# Patient Record
Sex: Female | Born: 1970
Health system: Southern US, Community
[De-identification: ages and names within clinical notes are randomized; demographics above are authoritative.]

## PROBLEM LIST (undated history)

## (undated) DIAGNOSIS — E063 Autoimmune thyroiditis: Secondary | ICD-10-CM

## (undated) DIAGNOSIS — J449 Chronic obstructive pulmonary disease, unspecified: Secondary | ICD-10-CM

## (undated) DIAGNOSIS — I10 Essential (primary) hypertension: Secondary | ICD-10-CM

## (undated) DIAGNOSIS — I7300 Raynaud's syndrome without gangrene: Secondary | ICD-10-CM

## (undated) HISTORY — DX: Chronic obstructive pulmonary disease, unspecified: J44.9

## (undated) HISTORY — DX: Raynaud's syndrome without gangrene: I73.00

## (undated) HISTORY — DX: Essential (primary) hypertension: I10

## (undated) HISTORY — DX: Autoimmune thyroiditis: E06.3

## (undated) HISTORY — PX: TONSILLECTOMY: SUR1361

## (undated) HISTORY — PX: SKIN GRAFT: SHX250

---

## 2003-05-27 ENCOUNTER — Emergency Department (HOSPITAL_COMMUNITY): Admission: EM | Admit: 2003-05-27 | Discharge: 2003-05-27 | Payer: Self-pay | Admitting: Emergency Medicine

## 2003-05-27 ENCOUNTER — Encounter: Payer: Self-pay | Admitting: Emergency Medicine

## 2004-11-13 ENCOUNTER — Ambulatory Visit: Payer: Self-pay | Admitting: Internal Medicine

## 2004-12-05 ENCOUNTER — Ambulatory Visit: Payer: Self-pay | Admitting: Internal Medicine

## 2006-02-24 ENCOUNTER — Emergency Department (HOSPITAL_COMMUNITY): Admission: EM | Admit: 2006-02-24 | Discharge: 2006-02-25 | Payer: Self-pay | Admitting: Emergency Medicine

## 2006-03-03 ENCOUNTER — Ambulatory Visit: Payer: Self-pay | Admitting: Internal Medicine

## 2006-03-04 ENCOUNTER — Ambulatory Visit: Payer: Self-pay | Admitting: Internal Medicine

## 2006-12-26 ENCOUNTER — Inpatient Hospital Stay (HOSPITAL_COMMUNITY): Admission: AD | Admit: 2006-12-26 | Discharge: 2006-12-26 | Payer: Self-pay | Admitting: Obstetrics & Gynecology

## 2010-02-26 ENCOUNTER — Ambulatory Visit: Payer: Self-pay | Admitting: Physician Assistant

## 2010-02-26 DIAGNOSIS — I10 Essential (primary) hypertension: Secondary | ICD-10-CM | POA: Insufficient documentation

## 2010-02-26 DIAGNOSIS — E039 Hypothyroidism, unspecified: Secondary | ICD-10-CM | POA: Insufficient documentation

## 2010-02-26 DIAGNOSIS — T300 Burn of unspecified body region, unspecified degree: Secondary | ICD-10-CM | POA: Insufficient documentation

## 2010-03-02 ENCOUNTER — Encounter: Payer: Self-pay | Admitting: Physician Assistant

## 2010-03-06 ENCOUNTER — Ambulatory Visit (HOSPITAL_COMMUNITY): Admission: RE | Admit: 2010-03-06 | Discharge: 2010-03-06 | Payer: Self-pay | Admitting: Internal Medicine

## 2010-03-08 ENCOUNTER — Telehealth: Payer: Self-pay | Admitting: Physician Assistant

## 2010-03-12 ENCOUNTER — Ambulatory Visit: Payer: Self-pay | Admitting: Physician Assistant

## 2010-03-26 ENCOUNTER — Ambulatory Visit (HOSPITAL_COMMUNITY): Admission: RE | Admit: 2010-03-26 | Discharge: 2010-03-26 | Payer: Self-pay | Admitting: Physician Assistant

## 2010-03-26 ENCOUNTER — Ambulatory Visit: Payer: Self-pay | Admitting: Physician Assistant

## 2010-03-26 DIAGNOSIS — R799 Abnormal finding of blood chemistry, unspecified: Secondary | ICD-10-CM

## 2010-03-26 DIAGNOSIS — R05 Cough: Secondary | ICD-10-CM

## 2010-03-27 LAB — CONVERTED CEMR LAB
Alkaline Phosphatase: 105 units/L (ref 39–117)
Basophils Relative: 0 % (ref 0–1)
Eosinophils Absolute: 0.4 10*3/uL (ref 0.0–0.7)
Hemoglobin: 14.1 g/dL (ref 12.0–15.0)
MCHC: 32.6 g/dL (ref 30.0–36.0)
Monocytes Absolute: 0.7 10*3/uL (ref 0.1–1.0)
Neutro Abs: 6.9 10*3/uL (ref 1.7–7.7)
Platelets: 350 10*3/uL (ref 150–400)
Potassium: 3.5 meq/L (ref 3.5–5.3)
RBC: 4.57 M/uL (ref 3.87–5.11)
RDW: 13.2 % (ref 11.5–15.5)
Sodium: 137 meq/L (ref 135–145)
TSH: 9.126 microintl units/mL — ABNORMAL HIGH (ref 0.350–4.500)
Total Bilirubin: 0.3 mg/dL (ref 0.3–1.2)
Total Protein: 7.2 g/dL (ref 6.0–8.3)
WBC: 10.1 10*3/uL (ref 4.0–10.5)

## 2010-03-28 ENCOUNTER — Encounter: Payer: Self-pay | Admitting: Physician Assistant

## 2010-03-30 ENCOUNTER — Encounter: Payer: Self-pay | Admitting: Physician Assistant

## 2010-05-09 ENCOUNTER — Ambulatory Visit: Payer: Self-pay | Admitting: Physician Assistant

## 2010-05-09 DIAGNOSIS — R6889 Other general symptoms and signs: Secondary | ICD-10-CM | POA: Insufficient documentation

## 2010-05-09 DIAGNOSIS — J4489 Other specified chronic obstructive pulmonary disease: Secondary | ICD-10-CM | POA: Insufficient documentation

## 2010-05-09 DIAGNOSIS — J449 Chronic obstructive pulmonary disease, unspecified: Secondary | ICD-10-CM | POA: Insufficient documentation

## 2010-05-10 ENCOUNTER — Telehealth: Payer: Self-pay | Admitting: Physician Assistant

## 2010-05-10 ENCOUNTER — Encounter: Payer: Self-pay | Admitting: Physician Assistant

## 2010-05-10 LAB — CONVERTED CEMR LAB
Free T4: 1.2 ng/dL (ref 0.80–1.80)
TSH: 3.458 microintl units/mL (ref 0.350–4.500)

## 2010-05-11 ENCOUNTER — Encounter: Payer: Self-pay | Admitting: Physician Assistant

## 2010-05-16 ENCOUNTER — Encounter: Payer: Self-pay | Admitting: Physician Assistant

## 2010-08-09 ENCOUNTER — Ambulatory Visit: Payer: Self-pay | Admitting: Physician Assistant

## 2010-08-09 DIAGNOSIS — L509 Urticaria, unspecified: Secondary | ICD-10-CM | POA: Insufficient documentation

## 2010-08-09 DIAGNOSIS — F341 Dysthymic disorder: Secondary | ICD-10-CM

## 2010-08-09 LAB — CONVERTED CEMR LAB
Bilirubin Urine: NEGATIVE
Glucose, Urine, Semiquant: NEGATIVE
pH: 6

## 2010-08-10 ENCOUNTER — Encounter (INDEPENDENT_AMBULATORY_CARE_PROVIDER_SITE_OTHER): Payer: Self-pay | Admitting: Internal Medicine

## 2010-08-10 ENCOUNTER — Encounter: Payer: Self-pay | Admitting: Physician Assistant

## 2010-08-10 DIAGNOSIS — N390 Urinary tract infection, site not specified: Secondary | ICD-10-CM | POA: Insufficient documentation

## 2010-08-10 LAB — CONVERTED CEMR LAB
BUN: 7 mg/dL (ref 6–23)
CO2: 26 meq/L (ref 19–32)
Calcium: 9.6 mg/dL (ref 8.4–10.5)
Chloride: 95 meq/L — ABNORMAL LOW (ref 96–112)
Creatinine, Ser: 0.57 mg/dL (ref 0.40–1.20)
Glucose, Bld: 85 mg/dL (ref 70–99)
Potassium: 3.6 meq/L (ref 3.5–5.3)
Sodium: 135 meq/L (ref 135–145)

## 2010-08-15 ENCOUNTER — Encounter: Admission: RE | Admit: 2010-08-15 | Discharge: 2010-08-15 | Payer: Self-pay | Admitting: Internal Medicine

## 2010-08-17 ENCOUNTER — Encounter: Payer: Self-pay | Admitting: Physician Assistant

## 2010-08-20 ENCOUNTER — Encounter: Payer: Self-pay | Admitting: Physician Assistant

## 2010-08-20 ENCOUNTER — Ambulatory Visit (HOSPITAL_COMMUNITY): Admission: RE | Admit: 2010-08-20 | Discharge: 2010-08-20 | Payer: Self-pay | Admitting: Internal Medicine

## 2010-08-23 ENCOUNTER — Encounter: Payer: Self-pay | Admitting: Physician Assistant

## 2010-08-23 ENCOUNTER — Encounter: Admission: RE | Admit: 2010-08-23 | Discharge: 2010-08-23 | Payer: Self-pay | Admitting: Internal Medicine

## 2010-09-20 ENCOUNTER — Ambulatory Visit: Payer: Self-pay | Admitting: Physician Assistant

## 2010-09-20 LAB — CONVERTED CEMR LAB
BUN: 7 mg/dL (ref 6–23)
CO2: 26 meq/L (ref 19–32)
Calcium: 9.1 mg/dL (ref 8.4–10.5)
Creatinine, Ser: 0.6 mg/dL (ref 0.40–1.20)
Glucose, Bld: 99 mg/dL (ref 70–99)
Glucose, Urine, Semiquant: NEGATIVE
Ketones, urine, test strip: NEGATIVE
Nitrite: POSITIVE
Protein, U semiquant: NEGATIVE

## 2010-09-21 ENCOUNTER — Telehealth: Payer: Self-pay | Admitting: Physician Assistant

## 2010-09-21 ENCOUNTER — Encounter: Payer: Self-pay | Admitting: Physician Assistant

## 2010-09-27 ENCOUNTER — Ambulatory Visit (HOSPITAL_COMMUNITY): Admission: RE | Admit: 2010-09-27 | Discharge: 2010-09-27 | Payer: Self-pay | Admitting: Internal Medicine

## 2010-09-28 ENCOUNTER — Encounter (INDEPENDENT_AMBULATORY_CARE_PROVIDER_SITE_OTHER): Payer: Self-pay | Admitting: *Deleted

## 2010-09-28 DIAGNOSIS — E042 Nontoxic multinodular goiter: Secondary | ICD-10-CM

## 2010-10-21 ENCOUNTER — Observation Stay (HOSPITAL_COMMUNITY): Admission: EM | Admit: 2010-10-21 | Discharge: 2010-10-21 | Payer: Self-pay | Admitting: Emergency Medicine

## 2010-12-17 ENCOUNTER — Ambulatory Visit: Payer: Self-pay | Admitting: Nurse Practitioner

## 2010-12-21 ENCOUNTER — Ambulatory Visit: Payer: Self-pay | Admitting: Nurse Practitioner

## 2010-12-21 DIAGNOSIS — M25529 Pain in unspecified elbow: Secondary | ICD-10-CM

## 2010-12-28 ENCOUNTER — Ambulatory Visit: Payer: Self-pay | Admitting: Nurse Practitioner

## 2011-01-19 ENCOUNTER — Other Ambulatory Visit: Payer: Self-pay | Admitting: Internal Medicine

## 2011-01-19 DIAGNOSIS — N63 Unspecified lump in unspecified breast: Secondary | ICD-10-CM

## 2011-01-20 ENCOUNTER — Encounter: Payer: Self-pay | Admitting: Internal Medicine

## 2011-01-27 LAB — CONVERTED CEMR LAB
AST: 44 units/L — ABNORMAL HIGH (ref 0–37)
Albumin: 4.2 g/dL (ref 3.5–5.2)
Amphetamine Screen, Ur: NEGATIVE
Basophils Absolute: 0.1 10*3/uL (ref 0.0–0.1)
Basophils Relative: 1 % (ref 0–1)
CO2: 26 meq/L (ref 19–32)
Chloride: 100 meq/L (ref 96–112)
Cocaine Metabolites: NEGATIVE
Creatinine, Ser: 0.64 mg/dL (ref 0.40–1.20)
Creatinine,U: 122.1 mg/dL
Free T4: 0.83 ng/dL (ref 0.80–1.80)
HCT: 41.4 % (ref 36.0–46.0)
Hemoglobin: 13.6 g/dL (ref 12.0–15.0)
Lymphocytes Relative: 22 % (ref 12–46)
MCHC: 32.9 g/dL (ref 30.0–36.0)
MCV: 95.6 fL (ref 78.0–100.0)
Monocytes Absolute: 0.8 10*3/uL (ref 0.1–1.0)
Neutro Abs: 7.5 10*3/uL (ref 1.7–7.7)
Opiate Screen, Urine: NEGATIVE
Platelets: 327 10*3/uL (ref 150–400)
Potassium: 5.1 meq/L (ref 3.5–5.3)
Propoxyphene: NEGATIVE
RDW: 13.2 % (ref 11.5–15.5)
Sodium: 136 meq/L (ref 135–145)
TSH: 6.537 microintl units/mL — ABNORMAL HIGH (ref 0.350–4.500)
Total Protein: 7.5 g/dL (ref 6.0–8.3)

## 2011-01-29 ENCOUNTER — Encounter (INDEPENDENT_AMBULATORY_CARE_PROVIDER_SITE_OTHER): Payer: Self-pay | Admitting: Internal Medicine

## 2011-01-29 NOTE — Letter (Signed)
Summary: Handout Printed  Printed Handout:  - Diet - Sodium-Controlled 
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
## 2011-01-29 NOTE — Progress Notes (Signed)
Summary[facility] Request[facility] for current [facility]  Phone Note From Other Clinic   Summary of Call: Jenin[facility] just called in because the pt above has an appointment on Monday and they need last office notes or anything that states breast cancer history or any recent mammogram. office [phone] fax [phone] [facility] PA-c  Initial call taken by: Manon Hilding,  May 10, 2010 11:43 AM  Follow-up for Phone Call        records faxed.Marland KitchenMarland KitchenMikey College CMA  May 11, 2010 2:23 PM
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
## 2011-01-29 NOTE — Letter (Signed)
Summary: PLASTIC SURGEON NOTES  PLASTIC SURGEON NOTES   Imported By: Arta Bruce 04/16/2010 12:14:58  _____________________________________________________________________  External Attachment:    Type:   Image     Comment:   External Document

## 2011-01-29 NOTE — Letter (Signed)
Summary: MED/SOLUTIONS//APPROVED  MED/SOLUTIONS//APPROVED   Imported By: Arta Bruce 10/01/2010 12:40:44  _____________________________________________________________________  External Attachment:    Type:   Image     Comment:   External Document
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
## 2011-01-29 NOTE — Letter (Signed)
Summary: *HSN Results Follow up  [facility]  [address] [place], [place] [address]   Phone: [phone]  Fax: [phone]      05/10/2010   [name] [address] [place], [place]  [address]   Dear  Ms. Talishia Rippetoe,                            ____S.Drinkard,FNP   ____D. Gore,FNP       ____B. McPherson,MD   ____V. Rankins,MD    ____E. Mulberry,MD    ____N. Daphine Deutscher, FNP  ____D. Reche Dixon, MD    ____K. Philipp Deputy, MD    __x__S. Alben Spittle, PA-C     This letter is to inform you that your recent test(s):  _______Pap Smear    ___x____Lab Test     _______X-ray    __x_____ is within acceptable limits  _______ requires a medication change  _______ requires a follow-up lab visit  _______ requires a follow-up visit with your provider   Comments: Thyroid test ok.  Keep taking same dose.  Repeat lab (TSH) in 3 months.       _________________________________________________________ If you have any questions, please contact our office                     Sincerely,  Tereso Newcomer PA-C [facility]
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
## 2011-01-29 NOTE — Letter (Signed)
Summary: *HSN Results Follow up  [facility]  [address] [place], [place] [address]   Phone: [phone]  Fax: [phone]      08/10/2010   Kelly Deleon [address] [place], [place]  [address]   Dear  Kelly Deleon,                            ____S.Drinkard,FNP   ____D. Gore,FNP       ____B. McPherson,MD   ____V. Rankins,MD    ____E. Mulberry,MD    ____N. Daphine Deutscher, FNP  ____D. Reche Dixon, MD    ____K. Philipp Deputy, MD    __x__S. Alben Spittle, PA-C    This letter is to inform you that your recent test(s):  _______Pap Smear    ___x____Lab Test     _______X-ray    ___x____ is within acceptable limits  _______ requires a medication change  _______ requires a follow-up lab visit  _______ requires a follow-up visit with your provider   Comments:  Kidney function ok.  Thyroid levels are good.  Continue the same thyroid medicine.       _________________________________________________________ If you have any questions, please contact our office                     Sincerely,  Tereso Newcomer PA-C [facility]
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
## 2011-01-29 NOTE — Letter (Signed)
Summary: [facility]  [facility]   Imported By: Arta Bruce 09/25/2010 12:49:56  _____________________________________________________________________  External Attachment:    Type:   Image     Comment:   External Document

## 2011-01-29 NOTE — Progress Notes (Signed)
Summary: needs repeat thyroid u/s in 08/2010  Phone Note Outgoing Call   Summary of Call: Tell patient that ultrasound shows multiple benign appearing nodules. She needs to continue her thyroid med. and have a repeat ultrasound in 6 mos. Order on your desk. Initial call taken by: Tereso Newcomer PA-C,  March 08, 2010 1:42 PM  Follow-up for Phone Call        Left message on answering machine for pt to call back...Marland KitchenMarland KitchenArmenia Shannon  March 08, 2010 3:45 PM   Left message on answering machine for pt to call back...Marland KitchenMarland KitchenArmenia Shannon  March 09, 2010 10:53 AM   Additional Follow-up for Phone Call Additional follow up Details #1::        pt is aware Additional Follow-up by: Armenia Shannon,  March 13, 2010 8:14 AM

## 2011-01-29 NOTE — Letter (Signed)
Summary: [facility]  [facility]   Imported By: Arta Bruce 04/02/2010 09:51:14  _____________________________________________________________________  External Attachment:    Type:   Image     Comment:   External Document

## 2011-01-29 NOTE — Letter (Signed)
Summary: *HSN Results Follow up  [facility]  [address] [place], [place] [address]   Phone: [phone]  Fax: [phone]      09/28/2010   Kelly Deleon [address] [place], [place]  [address]   Dear  Ms. [name],                            ____S.Drinkard,FNP   ____D. Gore,FNP       ____B. McPherson,MD   ____V. Rankins,MD    ____E. Mulberry,MD    ____N. Daphine Deutscher, FNP  ____D. Reche Dixon, MD    ____K. Philipp Deputy, MD    ____Other     This letter is to inform you that your recent test(s):  _______Pap Smear    _______Lab Test     _______X-ray   ___X_____Ultrasound    ____X___ is within acceptable limits  _______ requires a medication change  _______ requires a follow-up lab visit  _______ requires a follow-up visit with your [name]   Comments: We will repeat in 1-2 years.       _________________________________________________________ If you have any questions, please contact our office                     Sincerely,  Armenia Shannon [facility]

## 2011-01-29 NOTE — Letter (Signed)
Summary: MED/SOLUTION//APPROVED  MED/SOLUTION//APPROVED   Imported By: Arta Bruce 08/23/2010 10:09:01  _____________________________________________________________________  External Attachment:    Type:   Image     Comment:   External Document

## 2011-01-29 NOTE — Letter (Signed)
Summary: MED/SOLUTIONS  MED/SOLUTIONS   Imported By: Arta Bruce 05/14/2010 10:51:33  _____________________________________________________________________  External Attachment:    Type:   Image     Comment:   External Document
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
## 2011-01-29 NOTE — Letter (Signed)
Summary: MED/SOLUTIONS/APPROVED  MED/SOLUTIONS/APPROVED   Imported By: Arta Bruce 05/08/2010 15:11:54  _____________________________________________________________________  External Attachment:    Type:   Image     Comment:   External Document

## 2011-01-29 NOTE — Letter (Signed)
Summary: PLASTIC SURGERY NOTE  PLASTIC SURGERY NOTE   Imported By: Arta Bruce 05/21/2010 09:22:09  _____________________________________________________________________  External Attachment:    Type:   Image     Comment:   External Document

## 2011-01-29 NOTE — Assessment & Plan Note (Signed)
Summary: Anxiety; COPD; HTN; Hives   Vital Signs:  Patient profile:   40 year old female Height:      65 inches Weight:      138 pounds BMI:     23.05 Temp:     98.7 degrees F oral Pulse rate:   88 / minute Pulse rhythm:   regular Resp:     18 per minute BP sitting:   110 / 86  (left arm) Cuff size:   regular  Vitals Entered By: [place] Shannon (September 20, 2010 9:46 AM) CC: f/u.... pt says she is only taking lisinopril/hctz and not norvasc... Is Patient Diabetic? No Pain Assessment Patient in pain? no       Does patient need assistance? Functional Status Self care Ambulation Normal   Primary Care Provider:  Tereso Newcomer PA-C  CC:  f/u.... pt says she is only taking lisinopril/hctz and not norvasc....  History of Present Illness: Here for f/u. Mood is slightly better.  Less anxious.  Using vistaril occ. Still has breakouts of "hives."  Has had for years.  Does not itch.  Somewhat tender. The TAC cream helps them resolve.  Daughter has same thing.  No joint problems.   Breathing better.  Coughing and wheezing less.  No chest pain or dyspnea. BP much better.  Feels better on Lisinopril/HCTZ.   Current Medications (verified): 1)  Norvasc 10 Mg Tabs (Amlodipine Besylate) .... Take 1 Tablet By Mouth Once A Day For Blood Pressure 2)  Synthroid 50 Mcg Tabs (Levothyroxine Sodium) .... Take 1 Tablet By Mouth Once A Day 3)  Lisinopril-Hydrochlorothiazide 20-12.5 Mg Tabs (Lisinopril-Hydrochlorothiazide) .... Take 1 Tablet By Mouth Once A Day For Blood Pressure (Pharmacy: Hctz 25 Mg Has Been D/c'd) 4)  Proventil Hfa 108 (90 Base) Mcg/act Aers (Albuterol Sulfate) .Marland Kitchen.. 1-2 Puffs Every 4-6 Hours As Needed For Cough or Wheezing 5)  Spiriva Handihaler 18 Mcg Caps (Tiotropium Bromide Monohydrate) .Marland Kitchen.. 1 Inhalation Once Daily 6)  Zoloft 50 Mg Tabs (Sertraline Hcl) .... Take 1 Tablet By Mouth Once A Day 7)  Vistaril 25 Mg Caps (Hydroxyzine Pamoate) .... Take 1 Capsule By Mouth Three  Times A Day As Needed For Anxiety 8)  Triamcinolone Acetonide 0.1 % Crea (Triamcinolone Acetonide) .... Apply To Rash Two Times A Day Until Clear; Do Not Use More Than One Week 9)  Macrobid 100 Mg Caps (Nitrofurantoin Monohyd Macro) .Marland Kitchen.. 1 Cap By Mouth Two Times A Day For 3 Days  Allergies (verified): No Known Drug Allergies  Physical Exam  General:  alert, well-developed, and well-nourished.   Head:  normocephalic and atraumatic.   Neck:  supple.   Lungs:  normal breath sounds and no wheezes.   Heart:  normal rate and regular rhythm.   Neurologic:  alert & oriented X3 and cranial nerves II-XII intact.   Skin:  scattered raised macules on right shoulder mildly tender to touch  Psych:  normally interactive.     Impression & Recommendations:  Problem # 1:  UTI (ICD-599.0) denies dysuria or frequency just completed tx of UTI reculture  The following medications were removed from the medication list:    Macrobid 100 Mg Caps (Nitrofurantoin monohyd macro) .Marland Kitchen... 1 cap by mouth two times a day for 3 days  Orders: UA Dipstick w/o Micro (manual) (60454) T-Culture, Urine ([id number])  Problem # 2:  HYPERTENSION (ICD-401.9) Assessment: Improved continue current meds  The following medications were removed from the medication list:    Norvasc 10 Mg Tabs (Amlodipine  besylate) .Marland Kitchen... Take 1 tablet by mouth once a day for blood pressure Her updated medication list for this problem includes:    Lisinopril-hydrochlorothiazide 20-12.5 Mg Tabs (Lisinopril-hydrochlorothiazide) .Marland Kitchen... Take 1 tablet by mouth once a day for blood pressure (pharmacy: hctz 25 mg has been d/c'd)  Orders: T-Basic Metabolic Panel [phone])  Problem # 3:  URTICARIA (ICD-708.9)  not 100% sure these are urticaria will send her to derm for eval  Orders: Dermatology Referral (Derma)  Problem # 4:  ANXIETY DEPRESSION (ICD-300.4) Assessment: Improved continue current med  Problem # 5:  COPD (ICD-496) will  track down PFTs cough and wheezing is better  Her updated medication list for this problem includes:    Proventil Hfa 108 (90 Base) Mcg/act Aers (Albuterol sulfate) .Marland Kitchen... 1-2 puffs every 4-6 hours as needed for cough or wheezing    Spiriva Handihaler 18 Mcg Caps (Tiotropium bromide monohydrate) .Marland Kitchen... 1 inhalation once daily  Problem # 6:  GOITER, MULTINODULAR (ICD-241.1) needs u/s this month to f/u  Complete Medication List: 1)  Synthroid 50 Mcg Tabs (Levothyroxine sodium) .... Take 1 tablet by mouth once a day 2)  Lisinopril-hydrochlorothiazide 20-12.5 Mg Tabs (Lisinopril-hydrochlorothiazide) .... Take 1 tablet by mouth once a day for blood pressure (pharmacy: hctz 25 mg has been d/c'd) 3)  Proventil Hfa 108 (90 Base) Mcg/act Aers (Albuterol sulfate) .Marland Kitchen.. 1-2 puffs every 4-6 hours as needed for cough or wheezing 4)  Spiriva Handihaler 18 Mcg Caps (Tiotropium bromide monohydrate) .Marland Kitchen.. 1 inhalation once daily 5)  Zoloft 50 Mg Tabs (Sertraline hcl) .... Take 1 tablet by mouth once a day 6)  Vistaril 25 Mg Caps (Hydroxyzine pamoate) .... Take 1 capsule by mouth three times a day as needed for anxiety 7)  Triamcinolone Acetonide 0.1 % Crea (Triamcinolone acetonide) .... Apply to rash two times a day until clear; do not use more than one week  Patient Instructions: 1)  Please request PFT report. 2)  Please schedule a follow-up appointment in 3 months for CPP. 3)  Someone will call you to arrange appt with Dermatology.   Laboratory Results   Urine Tests  Date/Time Received: September 20, 2010 10:16 AM   Routine Urinalysis   Glucose: negative   (Normal Range: Negative) Bilirubin: negative   (Normal Range: Negative) Ketone: negative   (Normal Range: Negative) Spec. Gravity: 1.020   (Normal Range: 1.003-1.035) Blood: negative   (Normal Range: Negative) pH: 5.5   (Normal Range: 5.0-8.0) Protein: negative   (Normal Range: Negative) Urobilinogen: 0.2   (Normal Range: 0-1) Nitrite:  positive   (Normal Range: Negative) Leukocyte Esterace: small   (Normal Range: Negative)

## 2011-01-29 NOTE — Letter (Signed)
Summary: TEST ORDER FORM/MAMMOGRAM//APPT DATE & TIME  TEST ORDER FORM/MAMMOGRAM//APPT DATE & TIME   Imported By: Arta Bruce 07/04/2010 15:46:25  _____________________________________________________________________  External Attachment:    Type:   Image     Comment:   External Document
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
## 2011-01-29 NOTE — Letter (Signed)
Summary: TEST ORDER FORM/ ULTRASOUND//APPT DATE & TIME  TEST ORDER FORM/ ULTRASOUND//APPT DATE & TIME   Imported By: Arta Bruce 04/27/2010 14:42:21  _____________________________________________________________________  External Attachment:    Type:   Image     Comment:   External Document

## 2011-01-29 NOTE — Progress Notes (Signed)
Summary: Office Visit//depression screening  Office Visit//depression screening   Imported By: Arta Bruce 08/13/2010 12:57:32  _____________________________________________________________________  External Attachment:    Type:   Image     Comment:   External Document

## 2011-01-29 NOTE — Letter (Signed)
Summary: *HSN Results Follow up  [facility]  [address] [place], [place] [address]   Phone: [phone]  Fax: [phone]      09/21/2010   Kelly Deleon [address] [place], [place]  [address]   Dear  Kelly Deleon,                            ____S.Drinkard,FNP   ____D. Gore,FNP       ____B. McPherson,MD   ____V. Rankins,MD    ____E. Mulberry,MD    ____N. Daphine Deutscher, FNP  ____D. Reche Dixon, MD    ____K. Philipp Deputy, MD    __x__S. Alben Spittle, PA-C     This letter is to inform you that your recent test(s):  _______Pap Smear    ___x____Lab Test     _______X-ray    __x_____ is within acceptable limits  _______ requires a medication change  _______ requires a follow-up lab visit  _______ requires a follow-up visit with your provider   Comments: Kidney function and potassium are normal.       _________________________________________________________ If you have any questions, please contact our office                     Sincerely,  Tereso Newcomer PA-C [facility]

## 2011-01-29 NOTE — Letter (Signed)
Summary: MED/SOLUTIONS  MED/SOLUTIONS   Imported By: Arta Bruce 07/09/2010 09:42:11  _____________________________________________________________________  External Attachment:    Type:   Image     Comment:   External Document

## 2011-01-31 ENCOUNTER — Ambulatory Visit
Admission: RE | Admit: 2011-01-31 | Discharge: 2011-01-31 | Disposition: A | Discharge status: Home / Self Care | Payer: Self-pay | Source: Ambulatory Visit | Attending: Internal Medicine | Admitting: Internal Medicine

## 2011-01-31 ENCOUNTER — Encounter (INDEPENDENT_AMBULATORY_CARE_PROVIDER_SITE_OTHER): Payer: Self-pay | Admitting: Nurse Practitioner

## 2011-01-31 ENCOUNTER — Other Ambulatory Visit: Payer: Self-pay | Admitting: Internal Medicine

## 2011-01-31 DIAGNOSIS — N6489 Other specified disorders of breast: Secondary | ICD-10-CM

## 2011-01-31 DIAGNOSIS — N63 Unspecified lump in unspecified breast: Secondary | ICD-10-CM
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
## 2011-02-06 NOTE — Letter (Signed)
Summary: MED/SOLUTIONS/APPROVED  MED/SOLUTIONS/APPROVED   Imported By: Arta Bruce 01/30/2011 16:59:30  _____________________________________________________________________  External Attachment:    Type:   Image     Comment:   External Document

## 2011-02-06 NOTE — Letter (Signed)
Summary: RADIOLOGY REPORT  RADIOLOGY REPORT   Imported By: Arta Bruce 02/01/2011 15:12:55  _____________________________________________________________________  External Attachment:    Type:   Image     Comment:   External Document

## 2011-02-15 ENCOUNTER — Encounter (INDEPENDENT_AMBULATORY_CARE_PROVIDER_SITE_OTHER): Payer: Self-pay | Admitting: Nurse Practitioner

## 2011-03-11 ENCOUNTER — Telehealth (INDEPENDENT_AMBULATORY_CARE_PROVIDER_SITE_OTHER): Payer: Self-pay | Admitting: Nurse Practitioner

## 2011-03-13 LAB — BASIC METABOLIC PANEL
Chloride: 97 mEq/L (ref 96–112)
GFR calc non Af Amer: >60 mL/min (ref >60)
Glucose, Bld: 113 mg/dL — ABNORMAL HIGH (ref 70–99)
Potassium: 3.2 mEq/L — ABNORMAL LOW (ref 3.5–5.1)
Sodium: 135 mEq/L (ref 135–145)

## 2011-03-13 LAB — CBC
HCT: 41.3 % (ref 36.0–46.0)
Hemoglobin: 14.3 g/dL (ref 12.0–15.0)
WBC: 13.2 10*3/uL — ABNORMAL HIGH (ref 4.0–10.5)

## 2011-03-13 LAB — DIFFERENTIAL
Lymphocytes Relative: 12 % (ref 12–46)
Monocytes Absolute: 1.1 10*3/uL — ABNORMAL HIGH (ref 0.1–1.0)
Monocytes Relative: 8 % (ref 3–12)
Neutro Abs: 10.5 10*3/uL — ABNORMAL HIGH (ref 1.7–7.7)

## 2011-03-19 ENCOUNTER — Telehealth (INDEPENDENT_AMBULATORY_CARE_PROVIDER_SITE_OTHER): Payer: Self-pay | Admitting: Nurse Practitioner

## 2011-03-19 NOTE — Progress Notes (Signed)
Summary: urgent-pain med needed  Phone Note Call from Patient Call back at Home Phone [phone]   Caller: Patient Summary of Call: PAIN lower back, excrutianing/tearful need acute visit, med today if at all possible Initial call taken by: Ernestine Mcmurray,  March 11, 2011 3:55 PM  Follow-up for Phone Call        Has been caring for future mother-in-law and father; last Friday lower back started "killing" her, is just hurting, pain is worsening.  Gets lower back pain often, in the small of her back, ibut this is different.  Pain is excruciating, is in tears, 9 on pain scale.  Piercing, stabbing pain.  Feels like it's bruised, but none visible.  Has taken tylenol, ibuprofen and alleve, nothing effective.  Tried someone else's diclofenac, has used heating pain with rest, only works until she has to move, has taken several warm showers, nothing is effective.  Advised of no availability of appointments -- to go to ED or UC for evaluation and treatment, especially if pain escalates or change in mobility occurs -- verbalized understanding and agreement.  States she will call in morning for cancellation if doesn't seek emergent care.   Follow-up by: Dutch Quint RN,  March 11, 2011 4:37 PM  Additional Follow-up for Phone Call Additional follow up Details #1::        advise pt that she has likely pulled some muscles she should take ibuprofen (cheap and effective) 800mg  by mouth three times a day with meals for the next 3 days. Tell pt to try it - it works. she should also use heat pad to affected area will also send flexeril muscle relaxer for use at night for muscles.  This will make pt sleepy. of course, avoid any extremes of lifting, bending and stooping until pain improves Additional Follow-up by: Lehman Prom FNP,  March 11, 2011 5:52 PM    Additional Follow-up for Phone Call Additional follow up Details #2::    Left message on answering machine for pt. to return call.  Dutch Quint RN   March 11, 2011 6:01 PM  Left message on answering machine for pt. to return call.  Dutch Quint RN  March 12, 2011 8:51 AM  Instructed pt. as per provider's recommendations, new Rxs and dosing.  Verbalized understanding and agreement.  Dutch Quint RN  March 12, 2011 9:08 AM   New/Updated Medications: IBUPROFEN 800 MG TABS (IBUPROFEN) One tablet by mouth three times a day as needed for pain FLEXERIL 10 MG TABS (CYCLOBENZAPRINE HCL) One tablet by mouth nightly as needed for muscles Prescriptions: FLEXERIL 10 MG TABS (CYCLOBENZAPRINE HCL) One tablet by mouth nightly as needed for muscles  #30 x 0   Entered and Authorized by:   Lehman Prom FNP   Signed by:   Lehman Prom FNP on 03/11/2011   Method used:   Electronically to        [facility] [address] [phone]* (retail)       [address]       [place], [place]  [address]       Ph: [phone] or [phone]       Fax: [phone]   RxID:   [id number] IBUPROFEN 800 MG TABS (IBUPROFEN) One tablet by mouth three times a day as needed for pain  #30 x 0   Entered and Authorized by:   Lehman Prom FNP   Signed by:   Lehman Prom FNP on  03/11/2011   Method used:   Electronically to        [facility]. [address] [phone]* (retail)       [address]       [place], [place]  [address]       Ph: [phone] or [phone]       Fax: [phone]   RxID:   [id number]

## 2011-03-27 ENCOUNTER — Encounter (INDEPENDENT_AMBULATORY_CARE_PROVIDER_SITE_OTHER): Payer: Self-pay | Admitting: Nurse Practitioner
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
## 2011-05-02 ENCOUNTER — Emergency Department (HOSPITAL_COMMUNITY): Payer: Medicaid Other

## 2011-05-02 ENCOUNTER — Emergency Department (HOSPITAL_COMMUNITY)
Admission: EM | Admit: 2011-05-02 | Discharge: 2011-05-03 | Disposition: A | Discharge status: Home / Self Care | Payer: Medicaid Other | Attending: Emergency Medicine | Admitting: Emergency Medicine

## 2011-05-02 DIAGNOSIS — S53126A Posterior dislocation of unspecified ulnohumeral joint, initial encounter: Secondary | ICD-10-CM | POA: Insufficient documentation

## 2011-05-02 DIAGNOSIS — R110 Nausea: Secondary | ICD-10-CM | POA: Insufficient documentation

## 2011-05-02 DIAGNOSIS — M25529 Pain in unspecified elbow: Secondary | ICD-10-CM | POA: Insufficient documentation

## 2011-05-02 LAB — CBC
HCT: 36 % (ref 36.0–46.0)
Hemoglobin: 12.3 g/dL (ref 12.0–15.0)
MCH: 32 pg (ref 26.0–34.0)
MCHC: 34.2 g/dL (ref 30.0–36.0)

## 2011-05-02 LAB — BASIC METABOLIC PANEL
GFR calc non Af Amer: >60 mL/min (ref >60)
Glucose, Bld: 96 mg/dL (ref 70–99)
Potassium: 3.5 mEq/L (ref 3.5–5.1)
Sodium: 137 mEq/L (ref 135–145)

## 2011-05-02 LAB — DIFFERENTIAL
Basophils Absolute: 0.1 10*3/uL (ref 0.0–0.1)
Lymphocytes Relative: 36 % (ref 12–46)
Lymphs Abs: 4.1 10*3/uL — ABNORMAL HIGH (ref 0.7–4.0)
Monocytes Absolute: 1 10*3/uL (ref 0.1–1.0)
Monocytes Relative: 9 % (ref 3–12)
Neutro Abs: 6 10*3/uL (ref 1.7–7.7)

## 2011-05-02 LAB — PROTIME-INR: INR: 0.95 (ref 0.00–1.49)

## 2011-05-03 ENCOUNTER — Encounter: Payer: Self-pay | Admitting: Physician Assistant

## 2011-05-16 ENCOUNTER — Ambulatory Visit
Admission: RE | Admit: 2011-05-16 | Discharge: 2011-05-16 | Disposition: A | Discharge status: Home / Self Care | Payer: Medicaid Other | Source: Ambulatory Visit | Attending: Family Medicine | Admitting: Family Medicine

## 2011-05-16 ENCOUNTER — Other Ambulatory Visit: Payer: Self-pay | Admitting: Family Medicine

## 2011-05-16 DIAGNOSIS — R609 Edema, unspecified: Secondary | ICD-10-CM

## 2011-05-16 DIAGNOSIS — R52 Pain, unspecified: Secondary | ICD-10-CM

## 2011-05-17 ENCOUNTER — Other Ambulatory Visit: Payer: Self-pay | Admitting: Family Medicine

## 2011-05-17 DIAGNOSIS — M25521 Pain in right elbow: Secondary | ICD-10-CM
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
## 2015-12-18 ENCOUNTER — Ambulatory Visit: Payer: Medicaid Other

## 2016-07-04 ENCOUNTER — Ambulatory Visit (INDEPENDENT_AMBULATORY_CARE_PROVIDER_SITE_OTHER): Payer: No Typology Code available for payment source | Admitting: Family Medicine

## 2016-07-04 ENCOUNTER — Encounter: Payer: Self-pay | Admitting: Family Medicine

## 2016-07-04 VITALS — BP 131/80 | HR 65 | Temp 97.6°F | Resp 16 | Ht 64.0 in | Wt 116.0 lb

## 2016-07-04 DIAGNOSIS — Z114 Encounter for screening for human immunodeficiency virus [HIV]: Secondary | ICD-10-CM

## 2016-07-04 DIAGNOSIS — I10 Essential (primary) hypertension: Secondary | ICD-10-CM

## 2016-07-04 DIAGNOSIS — R21 Rash and other nonspecific skin eruption: Secondary | ICD-10-CM

## 2016-07-04 DIAGNOSIS — Z1239 Encounter for other screening for malignant neoplasm of breast: Secondary | ICD-10-CM

## 2016-07-04 LAB — CBC WITH DIFFERENTIAL/PLATELET
BASOS ABS: 83 {cells}/uL (ref 0–200)
Basophils Relative: 1 %
EOS ABS: 83 {cells}/uL (ref 15–500)
EOS PCT: 1 %
HCT: 36.1 % (ref 35.0–45.0)
Hemoglobin: 11.7 g/dL (ref 11.7–15.5)
LYMPHS PCT: 18 %
Lymphs Abs: 1494 cells/uL (ref 850–3900)
MCH: 31.1 pg (ref 27.0–33.0)
MCHC: 32.4 g/dL (ref 32.0–36.0)
MCV: 96 fL (ref 80.0–100.0)
MONOS PCT: 11 %
MPV: 10.2 fL (ref 7.5–12.5)
Monocytes Absolute: 913 cells/uL (ref 200–950)
Neutro Abs: 5727 cells/uL (ref 1500–7800)
Neutrophils Relative %: 69 %
PLATELETS: 338 10*3/uL (ref 140–400)
RBC: 3.76 MIL/uL — ABNORMAL LOW (ref 3.80–5.10)
RDW: 16.6 % — AB (ref 11.0–15.0)
WBC: 8.3 10*3/uL (ref 3.8–10.8)

## 2016-07-04 LAB — LIPID PANEL
Cholesterol: 204 mg/dL — ABNORMAL HIGH (ref 125–200)
HDL: 101 mg/dL (ref >=46)
LDL Cholesterol: 87 mg/dL (ref <130)
Total CHOL/HDL Ratio: 2 Ratio (ref <=5.0)
Triglycerides: 81 mg/dL (ref <150)
VLDL: 16 mg/dL (ref <30)

## 2016-07-04 LAB — COMPLETE METABOLIC PANEL WITH GFR
ALBUMIN: 3.9 g/dL (ref 3.6–5.1)
ALK PHOS: 86 U/L (ref 33–115)
ALT: 14 U/L (ref 6–29)
AST: 37 U/L — ABNORMAL HIGH (ref 10–30)
BILIRUBIN TOTAL: 0.4 mg/dL (ref 0.2–1.2)
BUN: 9 mg/dL (ref 7–25)
CO2: 16 mmol/L — AB (ref 20–31)
CREATININE: 0.46 mg/dL — AB (ref 0.50–1.10)
Calcium: 8.7 mg/dL (ref 8.6–10.2)
Chloride: 99 mmol/L (ref 98–110)
GFR, Est Non African American: >89 mL/min (ref >=60)
GLUCOSE: 59 mg/dL — AB (ref 65–99)
Potassium: 4.2 mmol/L (ref 3.5–5.3)
Sodium: 137 mmol/L (ref 135–146)
TOTAL PROTEIN: 7.1 g/dL (ref 6.1–8.1)

## 2016-07-04 MED ORDER — ALBUTEROL SULFATE HFA 108 (90 BASE) MCG/ACT IN AERS: 2.0000 | INHALATION_SPRAY | Freq: Four times a day (QID) | RESPIRATORY_TRACT | Status: DC | PRN

## 2016-07-04 MED ORDER — LISINOPRIL 20 MG PO TABS: 20.0000 mg | ORAL_TABLET | Freq: Every day | ORAL | Status: AC

## 2016-07-04 MED ORDER — TIOTROPIUM BROMIDE MONOHYDRATE 18 MCG IN CAPS: 18.0000 ug | ORAL_CAPSULE | Freq: Every day | RESPIRATORY_TRACT | Status: AC

## 2016-07-04 MED FILL — VENTOLIN HFA 90 MCG INHALER: 108 (90 BAS | 20 days supply | Qty: 18 | Fill #0

## 2016-07-04 MED FILL — LISINOPRIL 20 MG TABLET: 20 | 30 days supply | Qty: 30 | Fill #0

## 2016-07-04 MED FILL — SPIRIVA 18 MCG CP-HANDIHALE: 18 | 30 days supply | Qty: 30 | Fill #0

## 2016-07-04 NOTE — Patient Instructions (Signed)
Make an appointment for 2 -4 weeks for PAP smear. Will let you know if anything needs attention before that.
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
## 2016-07-05 LAB — ANA, IFA COMPREHENSIVE PANEL
ANA: NEGATIVE
ENA SM Ab Ser-aCnc: <1
SM/RNP: <1
SSA (Ro) (ENA) Antibody, IgG: <1
SSB (LA) (ENA) ANTIBODY, IGG: NEGATIVE
Scleroderma (Scl-70) (ENA) Antibody, IgG: <1
ds DNA Ab: <1 IU/mL

## 2016-07-05 LAB — T3, FREE: T3 FREE: 2.6 pg/mL (ref 2.3–4.2)

## 2016-07-05 LAB — TSH: TSH: 3.52 m[IU]/L

## 2016-07-05 LAB — HIV ANTIBODY (ROUTINE TESTING W REFLEX): HIV: NONREACTIVE

## 2016-07-05 LAB — T4, FREE: FREE T4: 0.9 ng/dL (ref 0.8–1.8)

## 2016-08-05 ENCOUNTER — Encounter: Payer: Self-pay | Admitting: Family Medicine

## 2016-08-05 ENCOUNTER — Ambulatory Visit (INDEPENDENT_AMBULATORY_CARE_PROVIDER_SITE_OTHER): Payer: No Typology Code available for payment source | Admitting: Family Medicine

## 2016-08-05 VITALS — BP 134/82 | HR 101 | Temp 97.9°F | Resp 14 | Ht 64.0 in | Wt 114.0 lb

## 2016-08-05 DIAGNOSIS — I10 Essential (primary) hypertension: Secondary | ICD-10-CM

## 2016-08-05 MED ORDER — CELECOXIB 100 MG PO CAPS: 100.0000 mg | ORAL_CAPSULE | Freq: Two times a day (BID) | ORAL | 1 refills | Status: AC

## 2016-08-05 MED FILL — CELECOXIB 100 MG CAPSULE: 100 | 30 days supply | Qty: 60 | Fill #0

## 2016-08-05 NOTE — Progress Notes (Signed)
Kelly RoutJoanna Halt, is a 45 y.o. female  [id number]  [id number]  DOB - 04/13/1971  CC:  Chief Complaint  Patient presents with  . Follow-up    RHUMATOLOGY REFERRAL   . Hypertension  . Anxiety       HPI: Kelly Deleon is a 45 y.o. female here for follow-up of hypertension. She has a history of COPD, Hashimoto's Disease and Raynaud. He major complaint if of multiple joint pain, namely elbows, shoulders, fet and ankles. Her last bloodwork a little over a month ago showed normal thyroid function. Her other complaint today is of anxiety. She reports doing well with her COPD at this time.  No Known Allergies Past Medical History:  Diagnosis Date  . COPD (chronic obstructive pulmonary disease) (HCC)   . Hashimoto's disease   . Hypertension   . Raynaud disease    Current Outpatient Prescriptions on File Prior to Visit  Medication Sig Dispense Refill  . albuterol (PROVENTIL HFA;VENTOLIN HFA) 108 (90 Base) MCG/ACT inhaler Inhale 2 puffs into the lungs every 6 (six) hours as needed for wheezing or shortness of breath. 1 Inhaler 2  . lisinopril (PRINIVIL,ZESTRIL) 20 MG tablet Take 1 tablet (20 mg total) by mouth daily. 90 tablet 3  . tiotropium (SPIRIVA HANDIHALER) 18 MCG inhalation capsule Place 1 capsule (18 mcg total) into inhaler and inhale daily. 30 capsule 12   No current facility-administered medications on file prior to visit.    Family History  Problem Relation Age of Onset  . Heart disease Father   . Diabetes Father    Social History   Social History  . Marital status: Divorced    Spouse name: N/A  . Number of children: N/A  . Years of education: N/A   Occupational History  . Not on file.   Social History Main Topics  . Smoking status: Current Every Day Smoker    Packs/day: 0.50    Types: Cigarettes  . Smokeless tobacco: Never Used  . Alcohol use Yes     Comment: occ  . Drug use: No  . Sexual activity: Not on file   Other Topics Concern  . Not on  file   Social History Narrative  . No narrative on file    Review of Systems: Constitutional: Negative for fever, chills, appetite change, weight loss,  Fatigue. Skin: Negative for rashes or lesions of concern. HENT: Negative for ear pain, ear discharge.nose bleeds Eyes: Negative for pain, discharge, redness, itching and visual disturbance. Neck: Negative for pain, stiffness Respiratory: Negative for cough, shortness of breath,   Cardiovascular: Negative for chest pain, palpitations and leg swelling. Gastrointestinal: Negative for abdominal pain, nausea, vomiting, diarrhea, constipations Genitourinary: Negative for dysuria, urgency, frequency, hematuria,  Musculoskeletal:Positive for multiple joint pain, low back pain with sciatica. Neurological: Negative for dizziness, tremors, seizures, syncope,   light-headedness, numbness and headaches.  Hematological: Negative for easy bruising or bleeding Psychiatric/Behavioral: Negative for depression,  decreased concentration, confusion. Positive for anxiety   Objective:   Vitals:   08/05/16 0919 08/05/16 0929  BP: (!) 142/89 134/82  Pulse: (!) 101   Resp: 14   Temp: 97.9 F (36.6 C)     Physical Exam: Constitutional: Patient appears well-developed and well-nourished. No distress. HENT: Normocephalic, atraumatic, External right and left ear normal. Oropharynx is clear and moist.  Eyes: Conjunctivae and EOM are normal. PERRLA, no scleral icterus. Neck: Normal ROM. Neck supple. No lymphadenopathy, No thyromegaly. CVS: RRR, S1/S2 +, no murmurs, no gallops, no rubs Pulmonary: Effort  and breath sounds normal, no stridor, rhonchi, wheezes, rales.  Abdominal: Soft. Normoactive BS,, no distension, tenderness, rebound or guarding.  Musculoskeletal: Normal range of motion. No edema and no tenderness.  Neuro: Alert.Normal muscle tone coordination. Non-focal Skin: Skin is warm and dry. No rash noted. Not diaphoretic. No erythema. No  pallor. Psychiatric: Normal mood and affect. Behavior, judgment, thought content normal.  Lab Results  Component Value Date   WBC 8.3 07/04/2016   HGB 11.7 07/04/2016   HCT 36.1 07/04/2016   MCV 96.0 07/04/2016   PLT 338 07/04/2016   Lab Results  Component Value Date   CREATININE 0.46 (L) 07/04/2016   BUN 9 07/04/2016   NA 137 07/04/2016   K 4.2 07/04/2016   CL 99 07/04/2016   CO2 16 (L) 07/04/2016    No results found for: HGBA1C Lipid Panel     Component Value Date/Time   CHOL 204 (H) 07/04/2016 1003   TRIG 81 07/04/2016 1003   HDL 101 07/04/2016 1003   CHOLHDL 2.0 07/04/2016 1003   VLDL 16 07/04/2016 1003   LDLCALC 87 07/04/2016 1003       Assessment and plan:   1. Essential hypertension -continue current mediation  2. Joint pain -referral to orthopedist.    Return in about 6 months (around 02/05/2017).  The patient was given clear instructions to go to ER or return to medical center if symptoms don't improve, worsen or new problems develop. The patient verbalized understanding.    Henrietta Hoover FNP  08/05/2016, 11:41 AM

## 2016-08-05 NOTE — Patient Instructions (Signed)
Follow[facility] with [facility] about [facility] anxiety.

## 2016-08-27 ENCOUNTER — Other Ambulatory Visit: Payer: Self-pay | Admitting: Specialist

## 2016-09-04 ENCOUNTER — Ambulatory Visit: Payer: No Typology Code available for payment source

## 2016-09-10 ENCOUNTER — Other Ambulatory Visit: Payer: Self-pay | Admitting: *Deleted

## 2016-09-10 MED ORDER — ALBUTEROL SULFATE HFA 108 (90 BASE) MCG/ACT IN AERS: 2.0000 | INHALATION_SPRAY | Freq: Four times a day (QID) | RESPIRATORY_TRACT | 3 refills | Status: AC | PRN

## 2016-09-10 NOTE — Telephone Encounter (Signed)
PRINTED FOR PASS PROGRAM 

## 2017-02-05 ENCOUNTER — Ambulatory Visit: Payer: No Typology Code available for payment source | Admitting: Family Medicine
